# Patient Record
Sex: Female | Born: 1970 | Race: White | Hispanic: No | Marital: Single | Smoking: Current some day smoker
Health system: Southern US, Community
[De-identification: ages and names within clinical notes are randomized; demographics above are authoritative.]

---

## 2008-10-06 ENCOUNTER — Emergency Department (HOSPITAL_COMMUNITY): Admission: EM | Admit: 2008-10-06 | Discharge: 2008-10-06 | Payer: Self-pay | Admitting: Emergency Medicine

## 2014-08-16 ENCOUNTER — Emergency Department (HOSPITAL_COMMUNITY)
Admission: EM | Admit: 2014-08-16 | Discharge: 2014-08-16 | Disposition: A | Discharge status: Home / Self Care | Payer: Self-pay | Attending: Emergency Medicine | Admitting: Emergency Medicine

## 2014-08-16 ENCOUNTER — Emergency Department (HOSPITAL_COMMUNITY): Payer: Self-pay

## 2014-08-16 ENCOUNTER — Encounter (HOSPITAL_COMMUNITY): Payer: Self-pay | Admitting: Emergency Medicine

## 2014-08-16 DIAGNOSIS — Z720 Tobacco use: Secondary | ICD-10-CM | POA: Insufficient documentation

## 2014-08-16 DIAGNOSIS — Z880 Allergy status to penicillin: Secondary | ICD-10-CM | POA: Insufficient documentation

## 2014-08-16 DIAGNOSIS — M5416 Radiculopathy, lumbar region: Secondary | ICD-10-CM | POA: Insufficient documentation

## 2014-08-16 MED ORDER — DIAZEPAM 5 MG PO TABS: 5.0000 mg | ORAL_TABLET | Freq: Once | ORAL | Status: DC

## 2014-08-16 MED ORDER — KETOROLAC TROMETHAMINE 60 MG/2ML IM SOLN
60.0000 mg | Freq: Once | INTRAMUSCULAR | Status: AC
  Administered 2014-08-16: 60 mg via INTRAMUSCULAR
  Filled 2014-08-16: qty 2

## 2014-08-16 NOTE — ED Notes (Signed)
Pt states she pulled her back this morning and c/o severe burning back pain.

## 2014-08-16 NOTE — Discharge Instructions (Signed)
Lumbosacral Radiculopathy °Lumbosacral radiculopathy is a pinched nerve or nerves in the low back (lumbosacral area). When this happens you Scibilia have weakness in your legs and Burgueno not be able to stand on your toes. You Buczek have pain going down into your legs. There Chadderdon be difficulties with walking normally. There are many causes of this problem. Sometimes this Lippy happen from an injury, or simply from arthritis or boney problems. It Horiuchi also be caused by other illnesses such as diabetes. If there is no improvement after treatment, further studies Munce be done to find the exact cause. °DIAGNOSIS  °X-rays Caulfield be needed if the problems become long standing. Electromyograms Batch be done. This study is one in which the working of nerves and muscles is studied. °HOME CARE INSTRUCTIONS  °· Applications of ice packs Girten be helpful. Ice can be used in a plastic bag with a towel around it to prevent frostbite to skin. This Micco be used every 2 hours for 20 to 30 minutes, or as needed, while awake, or as directed by your caregiver. °· Only take over-the-counter or prescription medicines for pain, discomfort, or fever as directed by your caregiver. °· If physical therapy was prescribed, follow your caregiver's directions. °SEEK IMMEDIATE MEDICAL CARE IF:  °· You have pain not controlled with medications. °· You seem to be getting worse rather than better. °· You develop increasing weakness in your legs. °· You develop loss of bowel or bladder control. °· You have difficulty with walking or balance, or develop clumsiness in the use of your legs. °· You have a fever. °MAKE SURE YOU:  °· Understand these instructions. °· Will watch your condition. °· Will get help right away if you are not doing well or get worse. °Document Released: 05/10/2005 Document Revised: 08/02/2011 Document Reviewed: 12/29/2007 °Exit[employer]® Patient Information ©2015 [employer]. This information is not intended to replace advice given to you by your health  care provider. Make sure you discuss any questions you have with your health care provider. ° °

## 2014-08-16 NOTE — ED Provider Notes (Signed)
CSN: [id number]     Arrival date & time 08/16/14  1316 History  This chart was scribed for non-physician practitioner, Kathrynn Speed, PA-C working with Rolland Porter, MD by Gwenyth Ober, ED scribe. This patient was seen in room WTR8/WTR8 and the patient's care was started at 1:28 PM   Chief Complaint  Patient presents with  . Back Pain   The history is provided by the patient. No language interpreter was used.    HPI Comments: Gabriella Perry is a 44 y.o. female who presents to the Emergency Department complaining of constant, 10/10, burning lower back pain that radiates down her right leg and started 8 hours ago. Pt notes onset of symptoms occurred when she woke up this morning, but states she also felt a strain while tying her shoe this morning. She has tried Tylenol and Ibuprofen with no relief. Pt has a history of similar symptoms that occur intermittently after a back injury several years ago. She denies numbness, tingling and bladder or bowel incontinence.  No PCP  No past medical history on file. No past surgical history on file. No family history on file. History  Substance Use Topics  . Smoking status: Current Some Day Smoker  . Smokeless tobacco: Not on file  . Alcohol Use: Not on file   OB History    No data available     Review of Systems  Musculoskeletal: Positive for back pain and arthralgias.  Neurological: Negative for numbness.  All other systems reviewed and are negative.  Allergies  Penicillins and Tramadol  Home Medications   Prior to Admission medications   Not on File   BP 138/96 mmHg  Pulse 105  Temp(Src) 97.7 F (36.5 C) (Oral)  Resp 20  SpO2 100%  LMP 08/09/2014 Physical Exam  Constitutional: She is oriented to person, place, and time. She appears well-developed and well-nourished. No distress.  HENT:  Head: Normocephalic and atraumatic.  Mouth/Throat: Oropharynx is clear and moist.  Eyes: Conjunctivae and EOM are normal.  Neck: Normal range of  motion. Neck supple. No spinous process tenderness and no muscular tenderness present.  Cardiovascular: Normal rate, regular rhythm and normal heart sounds.   Pulmonary/Chest: Effort normal and breath sounds normal. No respiratory distress.  Musculoskeletal: Normal range of motion. She exhibits no edema.  TTP all of her lower back, more so lumbar spine and right paraspinal muscles without spasm. She screams in pain anywhere you touch her on her lower back, unless distracted.  Neurological: She is alert and oriented to person, place, and time. She has normal strength. No sensory deficit.  Strength lower extremities 5/5 and equal bilateral. Sensation intact. Normal gait.  Skin: Skin is warm and dry. No rash noted. She is not diaphoretic.  Psychiatric: She has a normal mood and affect. Her behavior is normal.  Nursing note and vitals reviewed.   ED Course  Procedures   DIAGNOSTIC STUDIES: Oxygen Saturation is 100% on RA, normal by my interpretation.    COORDINATION OF CARE: 1:30 PM Discussed treatment plan with pt which includes a back x-ray and muscle relaxer. Pt agreed to plan.   Labs Review Labs Reviewed - No data to display  Imaging Review Dg Lumbar Spine Complete  08/16/2014   CLINICAL DATA:  Reaching injury this morning. Heard something pop. Pain in the right hip and right back.  EXAM: LUMBAR SPINE - COMPLETE 4+ VIEW  COMPARISON:  None.  FINDINGS: Diminutive twelfth ribs. Curvature of the spine convex to the right.  Failure of separation at the T12-L1 level. Degenerative disc disease at L1-2, L3-4, L4-5 and L5-S1 with disc space narrowing. Lower lumbar facet arthropathy. Sacroiliac joints are normal.  IMPRESSION: Degenerative disc disease and degenerative facet disease in the lumbar region which could certainly be symptomatic. No acute radiographic finding.   Electronically Signed   By: Paulina FusiMark  Shogry M.D.   On: 08/16/2014 14:07     EKG Interpretation None      MDM   Final  diagnoses:  Lumbar radiculopathy   No red flags concerning patient's back pain. No s/s of central cord compression or cauda equina. Lower extremities are neurovascularly intact and patient is ambulating without difficulty. X-ray showing degenerative disc disease and degenerative facet disease. No other acute finding. No tachycardia on my exam. Afebrile. Toradol given in ED for pain. Given patient's screaming on exam unless distracted, I searched the [place] controlled substance reporting system, and it is noted she receives monthly prescriptions for Vicodin from her PCP Dr. Kern [facility]andt at [facility]. She received 120 tablets of hydrocodone acetaminophen 10-325 on 08/02/2014, a long with 30 tablets on 07/22/2014, despite filling 120 tablets on 07/06/2014. The other prescriber is Lemmie EvensJennifer [place], [place] at [facility]. Patient is denying having these prescriptions, and states her PCP Dr. Kern ReapVan Zandt will not prescribe her pain medicine anymore. I do not feel further prescriptions are necessary at this time and patient can be discharged. Stable for discharge.  I personally performed the services described in this documentation, which was scribed in my presence. The recorded information has been reviewed and is accurate.   Kathrynn SpeedRobyn M Keyerra Lamere, PA-C 08/16/14 1423  Rolland PorterMark James, MD 08/30/14 [phone]

## 2015-10-18 IMAGING — CR DG LUMBAR SPINE COMPLETE 4+V
5 series · 5 of 5 positions shown · non-contrast
Comparison: None.

CLINICAL DATA: Reaching injury this morning. Heard something pop.
Pain in the right hip and right back.

EXAM:
LUMBAR SPINE - COMPLETE 4+ VIEW

[t lumbar spine ap]
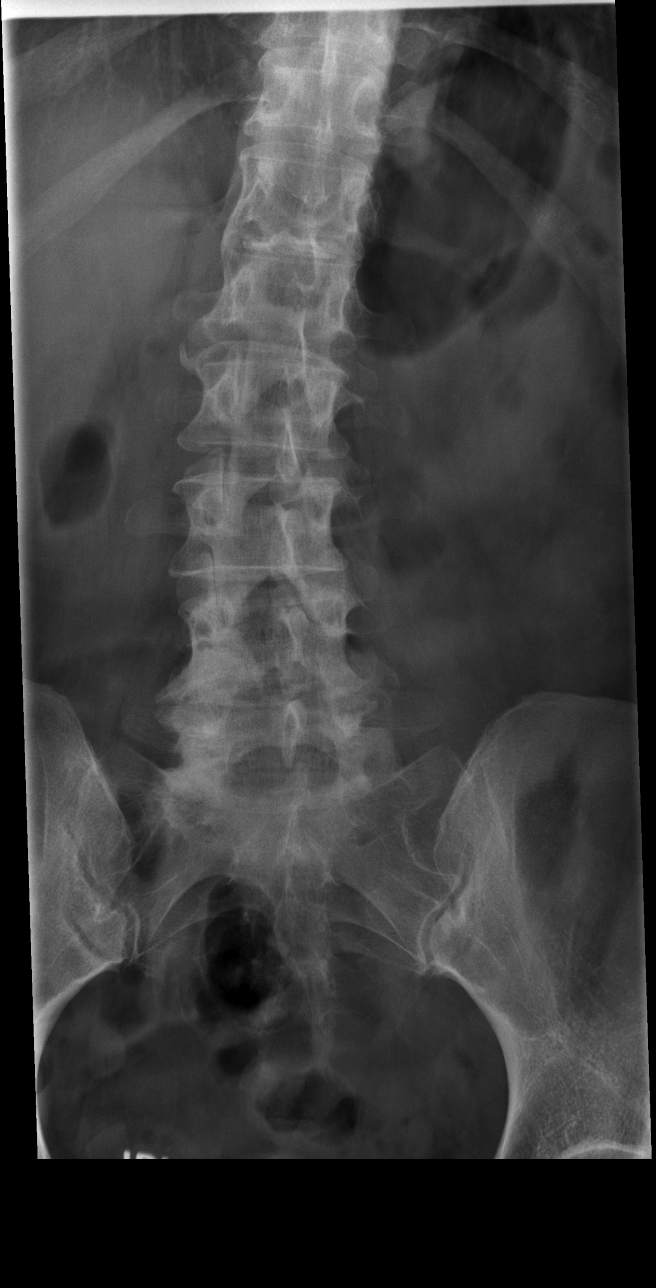

[t lumbar spine obl (1 of 2)]
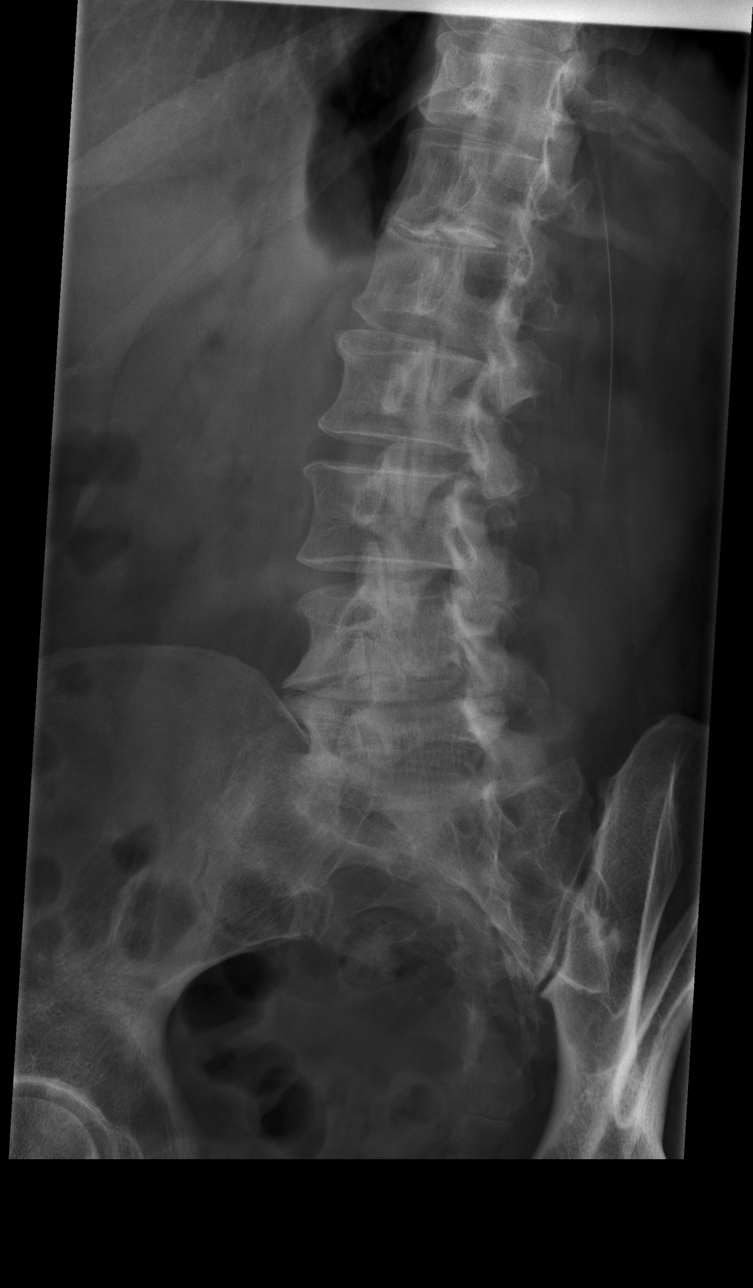

[t lumbar spine obl (2 of 2)]
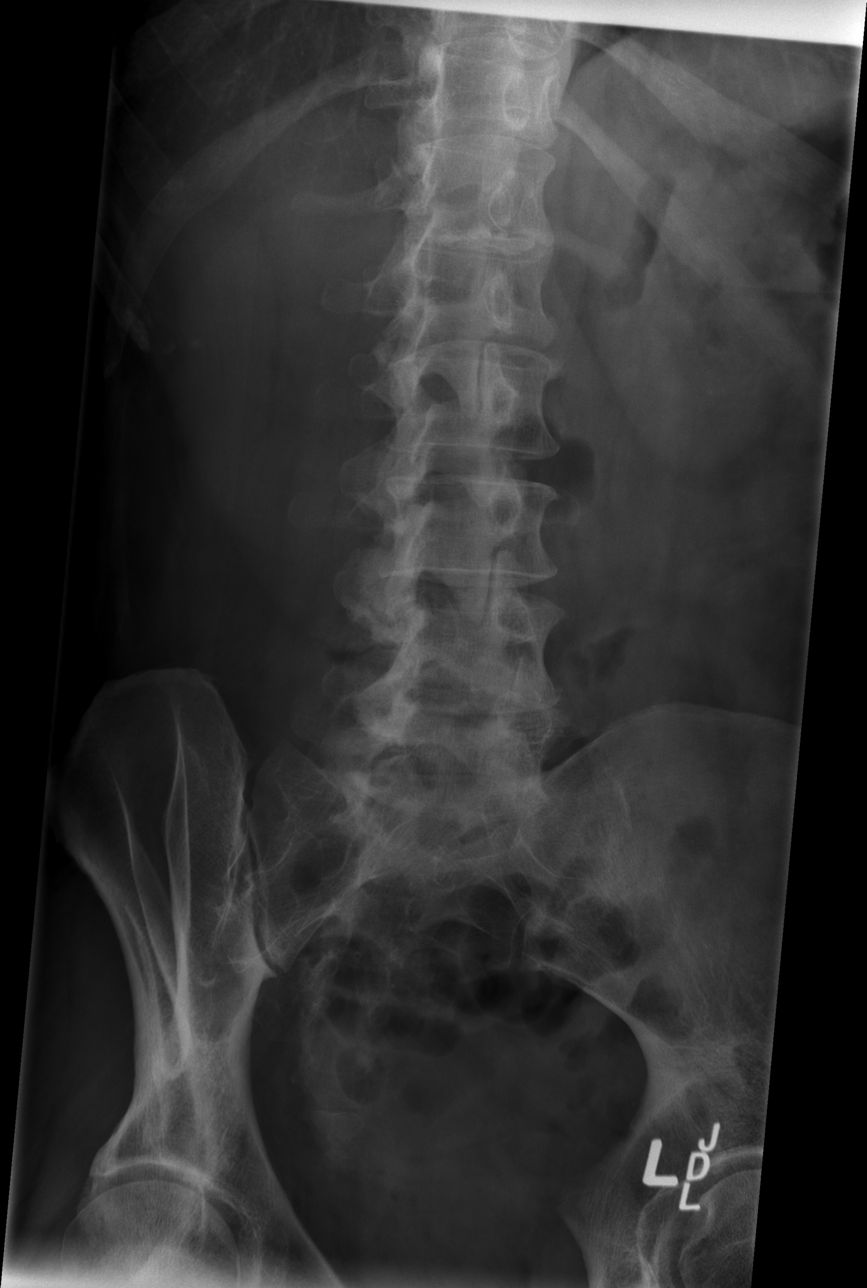

[t lumbar spine lat]
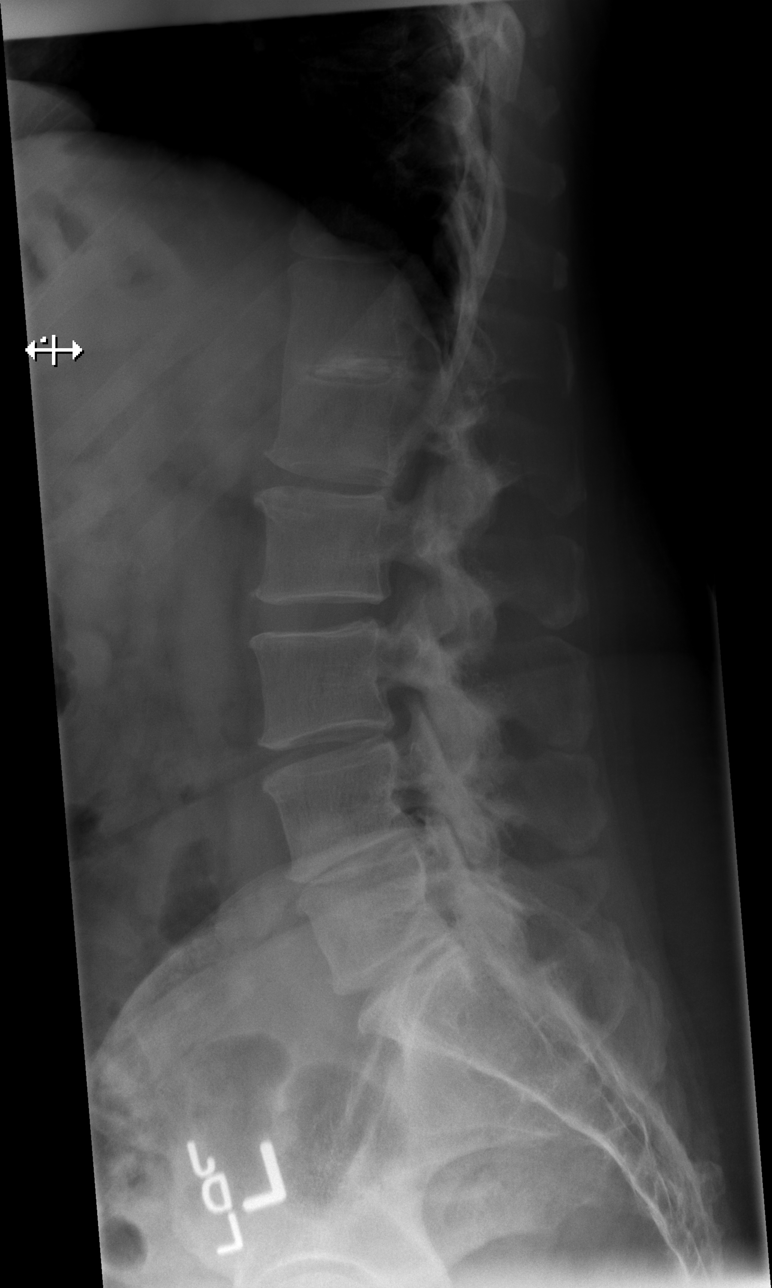

[t lumbar l-5 s-1 spot]
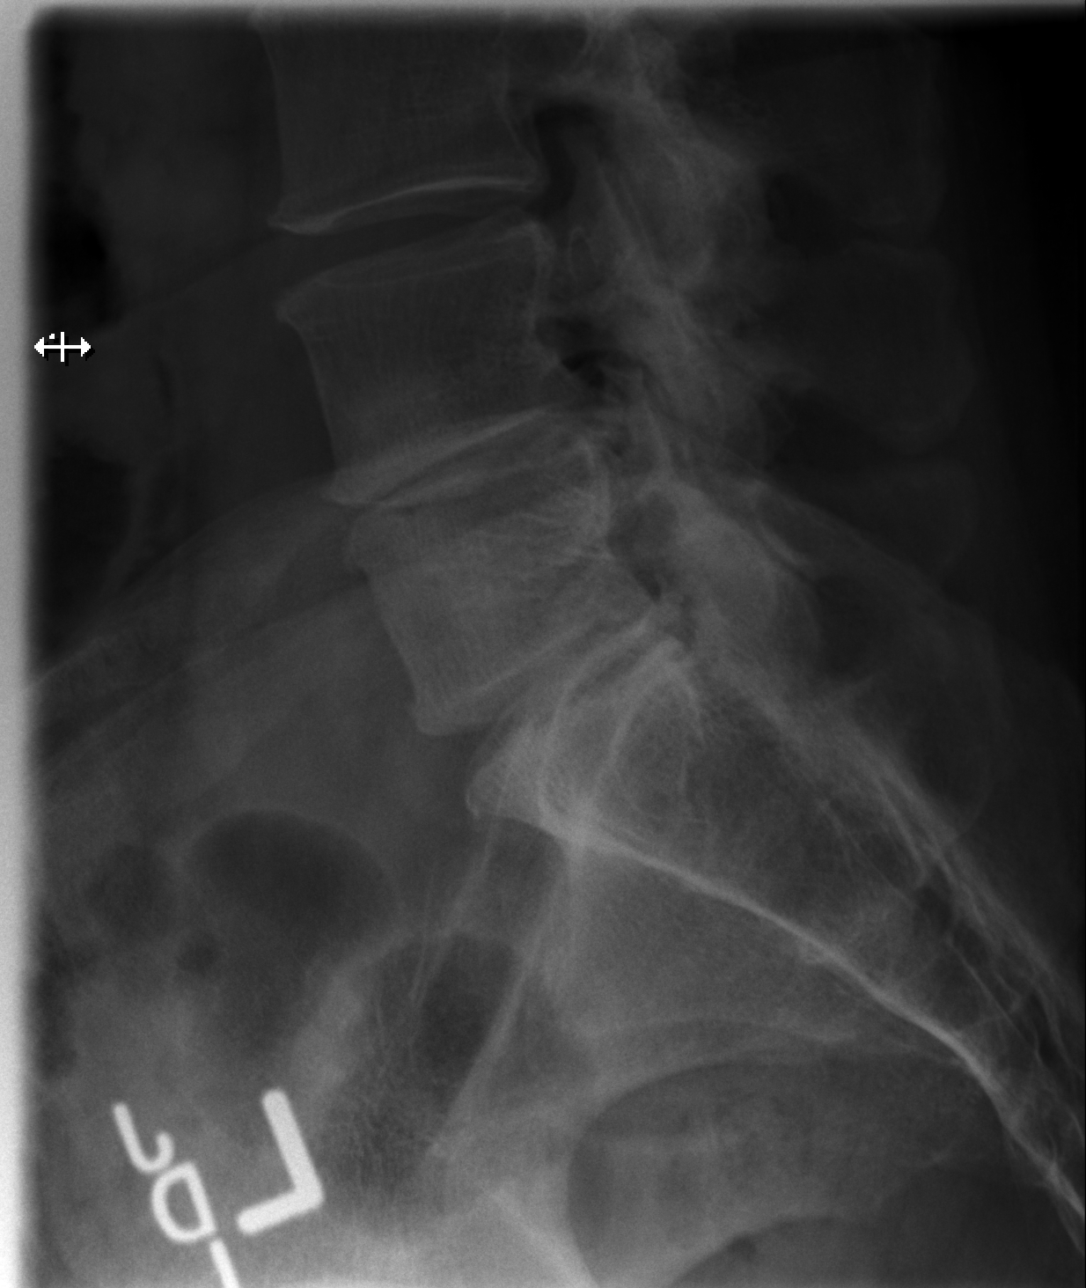

[5 of 5 positions shown; findings below may reference images not displayed]

FINDINGS: Diminutive twelfth ribs. Curvature of the spine convex to the right.
Failure of separation at the T12-L1 level. Degenerative disc disease
at L1-2, L3-4, L4-5 and L5-S1 with disc space narrowing. Lower
lumbar facet arthropathy. Sacroiliac joints are normal.
IMPRESSION: Degenerative disc disease and degenerative facet disease in the
lumbar region which could certainly be symptomatic. No acute
radiographic finding.
